# Patient Record
Sex: Male | Born: 1980 | Race: Black or African American | Hispanic: No | Marital: Single | Smoking: Never smoker
Health system: Southern US, Community
[De-identification: ages and names within clinical notes are randomized; demographics above are authoritative.]

## PROBLEM LIST (undated history)

## (undated) DIAGNOSIS — F329 Major depressive disorder, single episode, unspecified: Secondary | ICD-10-CM

## (undated) DIAGNOSIS — R05 Cough: Secondary | ICD-10-CM

## (undated) DIAGNOSIS — R059 Cough, unspecified: Secondary | ICD-10-CM

## (undated) DIAGNOSIS — R918 Other nonspecific abnormal finding of lung field: Secondary | ICD-10-CM

## (undated) DIAGNOSIS — F32A Depression, unspecified: Secondary | ICD-10-CM

## (undated) DIAGNOSIS — R0602 Shortness of breath: Secondary | ICD-10-CM

## (undated) HISTORY — DX: Depression, unspecified: F32.A

## (undated) HISTORY — DX: Cough: R05

## (undated) HISTORY — DX: Cough, unspecified: R05.9

## (undated) HISTORY — DX: Major depressive disorder, single episode, unspecified: F32.9

## (undated) HISTORY — DX: Shortness of breath: R06.02

---
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
## 2017-09-20 ENCOUNTER — Emergency Department
Admission: EM | Admit: 2017-09-20 | Discharge: 2017-09-20 | Disposition: A | Discharge status: Home / Self Care | Payer: No Typology Code available for payment source | Attending: Emergency Medicine | Admitting: Emergency Medicine

## 2017-09-20 ENCOUNTER — Emergency Department: Payer: No Typology Code available for payment source

## 2017-09-20 ENCOUNTER — Other Ambulatory Visit: Payer: Self-pay

## 2017-09-20 ENCOUNTER — Encounter: Payer: Self-pay | Admitting: Emergency Medicine

## 2017-09-20 DIAGNOSIS — Y929 Unspecified place or not applicable: Secondary | ICD-10-CM | POA: Insufficient documentation

## 2017-09-20 DIAGNOSIS — Y939 Activity, unspecified: Secondary | ICD-10-CM | POA: Diagnosis not present

## 2017-09-20 DIAGNOSIS — S0083XA Contusion of other part of head, initial encounter: Secondary | ICD-10-CM

## 2017-09-20 DIAGNOSIS — Y998 Other external cause status: Secondary | ICD-10-CM | POA: Insufficient documentation

## 2017-09-20 DIAGNOSIS — M7918 Myalgia, other site: Secondary | ICD-10-CM

## 2017-09-20 DIAGNOSIS — M549 Dorsalgia, unspecified: Secondary | ICD-10-CM | POA: Diagnosis not present

## 2017-09-20 DIAGNOSIS — M542 Cervicalgia: Secondary | ICD-10-CM | POA: Insufficient documentation

## 2017-09-20 DIAGNOSIS — S0990XA Unspecified injury of head, initial encounter: Secondary | ICD-10-CM | POA: Diagnosis present

## 2017-09-20 MED ORDER — NAPROXEN 500 MG PO TABS: 500.0000 mg | ORAL_TABLET | Freq: Two times a day (BID) | ORAL | Status: AC
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
## 2017-09-20 NOTE — Discharge Instructions (Signed)
Follow discharge care instruction take medication as directed. °
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
## 2017-09-20 NOTE — ED Triage Notes (Addendum)
Charting by Ivan Anchors, RN on 09/20/17 from [id number] entered on this pt in error.

## 2017-10-11 ENCOUNTER — Encounter: Payer: Self-pay | Admitting: Emergency Medicine

## 2017-10-11 ENCOUNTER — Emergency Department: Payer: Self-pay

## 2017-10-11 ENCOUNTER — Emergency Department
Admission: EM | Admit: 2017-10-11 | Discharge: 2017-10-11 | Disposition: A | Discharge status: Home / Self Care | Payer: Self-pay | Attending: Emergency Medicine | Admitting: Emergency Medicine

## 2017-10-11 DIAGNOSIS — T7840XA Allergy, unspecified, initial encounter: Secondary | ICD-10-CM | POA: Insufficient documentation

## 2017-10-11 DIAGNOSIS — J189 Pneumonia, unspecified organism: Secondary | ICD-10-CM | POA: Insufficient documentation

## 2017-10-11 DIAGNOSIS — R21 Rash and other nonspecific skin eruption: Secondary | ICD-10-CM | POA: Insufficient documentation

## 2017-10-11 DIAGNOSIS — R9389 Abnormal findings on diagnostic imaging of other specified body structures: Secondary | ICD-10-CM | POA: Insufficient documentation

## 2017-10-11 LAB — CBC
HCT: 42.3 % (ref 40.0–52.0)
Hemoglobin: 13.9 g/dL (ref 13.0–18.0)
MCH: 28.8 pg (ref 26.0–34.0)
MCHC: 32.9 g/dL (ref 32.0–36.0)
MCV: 87.5 fL (ref 80.0–100.0)
PLATELETS: 269 10*3/uL (ref 150–440)
RBC: 4.84 MIL/uL (ref 4.40–5.90)
RDW: 14 % (ref 11.5–14.5)
WBC: 10 10*3/uL (ref 3.8–10.6)

## 2017-10-11 LAB — BASIC METABOLIC PANEL
Anion gap: 9 (ref 5–15)
BUN: 11 mg/dL (ref 6–20)
CHLORIDE: 100 mmol/L — AB (ref 101–111)
CO2: 30 mmol/L (ref 22–32)
Calcium: 9.1 mg/dL (ref 8.9–10.3)
Creatinine, Ser: 1.25 mg/dL — ABNORMAL HIGH (ref 0.61–1.24)
GFR calc non Af Amer: >60 mL/min (ref >60)
GLUCOSE: 68 mg/dL (ref 65–99)
Potassium: 3.7 mmol/L (ref 3.5–5.1)
Sodium: 139 mmol/L (ref 135–145)

## 2017-10-11 LAB — TROPONIN I: Troponin I: <0.03 ng/mL (ref <0.03)

## 2017-10-11 MED ORDER — DOXYCYCLINE HYCLATE 100 MG PO CAPS: 100.0000 mg | ORAL_CAPSULE | Freq: Two times a day (BID) | ORAL | 0 refills | Status: AC

## 2017-10-11 MED ORDER — IOPAMIDOL (ISOVUE-370) INJECTION 76%
75.0000 mL | Freq: Once | INTRAVENOUS | Status: AC | PRN
  Administered 2017-10-11: 75 mL via INTRAVENOUS
  Filled 2017-10-11: qty 75

## 2017-10-11 MED ORDER — METHYLPREDNISOLONE SODIUM SUCC 125 MG IJ SOLR
125.0000 mg | Freq: Once | INTRAMUSCULAR | Status: AC
  Administered 2017-10-11: 125 mg via INTRAVENOUS
  Filled 2017-10-11 (×2): qty 2

## 2017-10-11 MED ORDER — SODIUM CHLORIDE 0.9 % IV BOLUS
1000.0000 mL | Freq: Once | INTRAVENOUS | Status: AC
  Administered 2017-10-11: 1000 mL via INTRAVENOUS
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
## 2017-10-11 NOTE — Discharge Instructions (Addendum)
Continue treating the allergic reaction with Benadryl at home, if you have increased hives, shortness of breath, trouble breathing fever, swelling of the tongue lips or throat or other progressive symptoms return to the emergency department.  Your CT scan shows what could be a pneumonia, however could also be an unusual finding such as a mass or other solid structure.  Is very difficult to tell in this early-stage exactly what it is.  We are concerned about this.  As we cannot tell for sure is a pneumonia but we are worried that it might be we will send you home with antibiotics, but we will also ask that you follow closely with Dr. Rogue Bussing in the next few days.  She has been made aware of your CT scan findings and she will follow closely.  It is to be hope that this is merely a pneumonia but if it is not or is something more serious you do absolutely need to follow-up.  Please do not neglect to do so because if this is a more serious pathology it could cause you real  harm if you delay.

## 2017-10-11 NOTE — ED Provider Notes (Addendum)
[facility] Emergency Department Provider Note  ____________________________________________   I have reviewed the triage vital signs and the nursing notes. Where available I have reviewed prior notes and, if possible and indicated, outside hospital notes.    HISTORY  Chief Complaint Allergic Reaction and Chest Pain    HPI Adam Higgins is a 37 y.o. male is healthy, runs etc.  He states that he was around some chlorine and then he started having yesterday and mild diffuse rash.  And then he woke up this morning and had a cough.  The cough is now gone.  He only coughed a few times he took some Mucinex and feels better.  He was having some pain when he took a breath.  No personal or family history of PE or DVT, he was having right-sided pleuritic chest pain however.  He denies any nausea or vomiting, or ongoing shortness of breath, he states after Benadryl yesterday his rash is nearly gone.  He feels that he was having an allergic reaction.  He does not have any history of personal or family history of PE or DVT no recent travel no leg swelling, no recent surgeries.     History reviewed. No pertinent past medical history.  There are no active problems to display for this patient.   History reviewed. No pertinent surgical history.  Prior to Admission medications   Medication Sig Start Date End Date Taking? Authorizing Provider  naproxen (NAPROSYN) 500 MG tablet Take 1 tablet (500 mg total) by mouth 2 (two) times daily with a meal. 09/20/17   Joni Reining, PA-C    Allergies Patient has no known allergies.  No family history on file.  Social History Social History   Tobacco Use  . Smoking status: Never Smoker  . Smokeless tobacco: Never Used  Substance Use Topics  . Alcohol use: Yes  . Drug use: Never    Review of Systems Constitutional: No fever/chills Eyes: No visual changes. ENT: No sore throat. No stiff neck no neck pain Cardiovascular:  See HPI Respiratory: The HPI Gastrointestinal:   no vomiting.  No diarrhea.  No constipation. Genitourinary: Negative for dysuria. Musculoskeletal: Negative lower extremity swelling Skin: + for rash. Neurological: Negative for severe headaches, focal weakness or numbness.   ____________________________________________   PHYSICAL EXAM:  VITAL SIGNS: ED Triage Vitals  Enc Vitals Group     BP 10/11/17 1540 103/87     Pulse Rate 10/11/17 1540 78     Resp 10/11/17 1540 18     Temp 10/11/17 1540 98 F (36.7 C)     Temp Source 10/11/17 1540 Oral     SpO2 10/11/17 1540 97 %     Weight 10/11/17 1539 170 lb (77.1 kg)     Height 10/11/17 1539  (1.803 m)     Head Circumference --      Peak Flow --      Pain Score 10/11/17 1538 6     Pain Loc --      Pain Edu? --      Excl. in GC? --     Constitutional: Alert and oriented. Well appearing and in no acute distress. Eyes: Conjunctivae are normal Head: Atraumatic HEENT: No congestion/rhinnorhea. Mucous membranes are moist.  Oropharynx non-erythematous Neck:   Nontender with no meningismus, no masses, no stridor Cardiovascular: Normal rate, regular rhythm. Grossly normal heart sounds.  Good peripheral circulation. Respiratory: Normal respiratory effort.  No retractions. Lungs CTAB. Abdominal: Soft and nontender. No  distention. No guarding no rebound Back:  There is no focal tenderness or step off.  there is no midline tenderness there are no lesions noted. there is no CVA tenderness Musculoskeletal: No lower extremity tenderness, no upper extremity tenderness. No joint effusions, no DVT signs strong distal pulses no edema Neurologic:  Normal speech and language. No gross focal neurologic deficits are appreciated.  Skin:  Skin is warm, dry and intact. No rash noted. Psychiatric: Mood and affect are normal. Speech and behavior are normal.  ____________________________________________   LABS (all labs ordered are listed, but only  abnormal results are displayed)  Labs Reviewed  BASIC METABOLIC PANEL - Abnormal; Notable for the following components:      Result Value   Chloride 100 (*)    Creatinine, Ser 1.25 (*)    All other components within normal limits  CBC  TROPONIN I    Pertinent labs  results that were available during my care of the patient were reviewed by me and considered in my medical decision making (see chart for details). ____________________________________________  EKG  I personally interpreted any EKGs ordered by me or triage Sinus rhythm, rate 66 bpm, no acute ST elevation or depression, nonspecific ST changes, repolarization abnormality noted ____________________________________________  RADIOLOGY  Pertinent labs & imaging results that were available during my care of the patient were reviewed by me and considered in my medical decision making (see chart for details). If possible, patient and/or family made aware of any abnormal findings.  Dg Chest 2 View  Result Date: 10/11/2017 CLINICAL DATA:  Chest pain with deep breathing beginning today. EXAM: CHEST - 2 VIEW COMPARISON:  None. FINDINGS: There is focal airspace disease in the right middle lobe worrisome for pneumonia. The left lung is clear. Heart size is normal. No pneumothorax or pleural effusion. IMPRESSION: Findings most consistent with right middle lobe pneumonia. Electronically Signed   By: Drusilla Kanner M.D.   On: 10/11/2017 16:12   ____________________________________________    PROCEDURES  Procedure(s) performed: None  Procedures  Critical Care performed: None  ____________________________________________   INITIAL IMPRESSION / ASSESSMENT AND PLAN / ED COURSE  Pertinent labs & imaging results that were available during my care of the patient were reviewed by me and considered in my medical decision making (see chart for details).  Patient here with a rash which apparently is mostly gone I do not see it at this  point, and also a pleuritic chest pain and cough, chest x-ray shows what looks to be a left peripheral pneumonia.  I am concerned that this may be a PE given pleuritic chest pain very low amount of pneumonia symptoms, and the appearance on chest x-ray.  I will obtain a CT scan to rule that out.  We will give him IV fluids and steroids for possible mild allergic reaction.  He could have a pulmonary pneumonitis I suppose from inhaling pool if [place] but again I think given that x-ray and the discordance between the degree of illness and the findings on chest x-ray further imaging is indicated  ----------------------------------------- 6:04 PM on 10/11/2017 ----------------------------------------- I personally reviewed x ray/ct D/w rads, they feel this could be round pna but also concern for ca.  He is in nad. We will treat with abx for possible pna but I am very concerned about these findings and I think he will need close oupt f/u with oncology. Paging onc.  Pt made aware of findings and concerns. Will start him on rx for cap.  Absolute need for f/u given and understood.   ----------------------------------------- 6:07 PM on 10/11/2017 -----------------------------------------  D/w dr. Rogue Bussing who agrees w Michaelle Copas and will f.u.     ____________________________________________   FINAL CLINICAL IMPRESSION(S) / ED DIAGNOSES  Final diagnoses:  None      This chart was dictated using voice recognition software.  Despite best efforts to proofread,  errors can occur which can change meaning.      Jeanmarie Plant, MD 10/11/17 1717    Jeanmarie Plant, MD 10/11/17 [phone]
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
## 2017-10-11 NOTE — ED Notes (Signed)
AAOx3.  Skin warm and dry.  NAD 

## 2017-10-19 ENCOUNTER — Encounter: Payer: Self-pay | Admitting: *Deleted

## 2017-10-19 ENCOUNTER — Encounter: Payer: Self-pay | Admitting: Hematology and Oncology

## 2017-10-19 ENCOUNTER — Inpatient Hospital Stay: Payer: Non-veteran care | Attending: Hematology and Oncology | Admitting: Hematology and Oncology

## 2017-10-19 VITALS — BP 139/76 | HR 83 | Temp 97.2°F | Resp 18 | Ht 71.0 in | Wt 160.0 lb

## 2017-10-19 DIAGNOSIS — R918 Other nonspecific abnormal finding of lung field: Secondary | ICD-10-CM | POA: Insufficient documentation

## 2017-10-19 DIAGNOSIS — Z77098 Contact with and (suspected) exposure to other hazardous, chiefly nonmedicinal, chemicals: Secondary | ICD-10-CM | POA: Diagnosis not present

## 2017-10-19 HISTORY — DX: Other nonspecific abnormal finding of lung field: R91.8

## 2017-10-19 NOTE — Progress Notes (Signed)
Patient c/o coughing, SOB
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
## 2017-10-19 NOTE — Progress Notes (Signed)
  Oncology Nurse Navigator Documentation  Navigator Location: [facility] (10/19/17 1500) Referral date to RadOnc/MedOnc: 10/12/17 (10/19/17 1500) )Navigator Encounter Type: Initial MedOnc (10/19/17 1500)   Abnormal Finding Date: 10/11/17 (10/19/17 1500)                   Treatment Phase: Abnormal Scans (10/19/17 1500) Barriers/Navigation Needs: Coordination of Care (10/19/17 1500)   Interventions: Coordination of Care (10/19/17 1500)   Coordination of Care: Appts;Radiology (10/19/17 1500)        Acuity: Level 1 (10/19/17 1500) Acuity Level 1: Initial guidance, education and coordination as needed;Minimal follow up required (10/19/17 1500)    met with patient during initial med-onc consultation with Dr. Mike Gip. All questions answered at the time of visit. Pt states is very anxious about CT scan results and nervous that he has cancer. Support and reassurance provided. Reviewed upcoming appts. Contact info given and instructed to call with any further questions or concerns. Pt verbalized understanding. Nothing further needed at this time.    Time Spent with Patient: 30 (10/19/17 1500)

## 2017-11-01 ENCOUNTER — Ambulatory Visit
Admission: RE | Admit: 2017-11-01 | Discharge: 2017-11-01 | Disposition: A | Discharge status: Home / Self Care | Payer: Non-veteran care | Source: Ambulatory Visit | Attending: Hematology and Oncology | Admitting: Hematology and Oncology

## 2017-11-01 DIAGNOSIS — R918 Other nonspecific abnormal finding of lung field: Secondary | ICD-10-CM | POA: Diagnosis present

## 2017-11-22 ENCOUNTER — Ambulatory Visit
Admission: RE | Admit: 2017-11-22 | Payer: Non-veteran care | Source: Ambulatory Visit | Admitting: Hematology and Oncology

## 2017-11-24 ENCOUNTER — Ambulatory Visit: Payer: Non-veteran care | Admitting: Hematology and Oncology

## 2018-12-26 ENCOUNTER — Ambulatory Visit (LOCAL_COMMUNITY_HEALTH_CENTER): Payer: Self-pay

## 2018-12-26 ENCOUNTER — Other Ambulatory Visit: Payer: Self-pay

## 2018-12-26 DIAGNOSIS — Z113 Encounter for screening for infections with a predominantly sexual mode of transmission: Secondary | ICD-10-CM

## 2018-12-26 DIAGNOSIS — N341 Nonspecific urethritis: Secondary | ICD-10-CM

## 2018-12-26 LAB — GRAM STAIN

## 2018-12-26 MED ORDER — AZITHROMYCIN 500 MG PO TABS
1000.0000 mg | ORAL_TABLET | Freq: Once | ORAL | Status: AC
  Administered 2018-12-26: 16:00:00 1000 mg via ORAL

## 2018-12-26 NOTE — Progress Notes (Addendum)
Here today for STD screening. Declines bloodwork. Elohim Brune, RN  Gram Stain results reviewed. Patient treated for NGU per standing orders. Amadu Schlageter, RN   

## 2018-12-26 NOTE — Progress Notes (Signed)
    STI clinic/screening visit  Subjective:  Adam Higgins is a 38 y.o. male being seen today for an STI screening visit. The patient reports they do not have symptoms.  Patient has the following medical conditions:  There are no active problems to display for this patient.    Chief Complaint  Patient presents with  . SEXUALLY TRANSMITTED DISEASE    HPI  Patient reports no sympts  but concerned that condom broke with sex on 12/21/2018,  Requests STD screen.  See flowsheet for further details and programmatic requirements.    The following portions of the patient's history were reviewed and updated as appropriate: allergies, current medications, past medical history, past social history, past surgical history and problem list.  Objective:  There were no vitals filed for this visit.  Physical Exam Constitutional:      Appearance: He is normal weight.  HENT:     Mouth/Throat:     Pharynx: Oropharynx is clear. No oropharyngeal exudate or posterior oropharyngeal erythema.  Neck:     Musculoskeletal: Neck supple. No muscular tenderness.  Abdominal:     General: Abdomen is flat.     Palpations: Abdomen is soft.  Genitourinary:    Penis: Normal.      Scrotum/Testes: Normal.  Lymphadenopathy:     Cervical: No cervical adenopathy.  Skin:    General: Skin is warm and dry.     Findings: No lesion or rash.  Neurological:     Mental Status: He is alert.    Assessment and Plan:  Adam Higgins is a 38 y.o. male presenting to the [facility] Department for STI screening  1. Screening examination for venereal disease - Gram stain - Gonococcus culture 2. NGU Treat gram stain for NGU with Azithromycin 1 gm po in a single dose Co. To notify partner to have treatment.  No sexual activity x 1 wk. Condoms always for STD prevention     No follow-ups on file.  No future appointments.  Hassell Done, FNP

## 2019-01-02 ENCOUNTER — Telehealth: Payer: Self-pay | Admitting: Physician Assistant

## 2019-01-02 ENCOUNTER — Ambulatory Visit: Payer: Self-pay | Admitting: Physician Assistant

## 2019-01-02 ENCOUNTER — Encounter: Payer: Self-pay | Admitting: Physician Assistant

## 2019-01-02 ENCOUNTER — Other Ambulatory Visit: Payer: Self-pay

## 2019-01-02 DIAGNOSIS — A5401 Gonococcal cystitis and urethritis, unspecified: Secondary | ICD-10-CM

## 2019-01-02 LAB — GONOCOCCUS CULTURE

## 2019-01-02 MED ORDER — AZITHROMYCIN 500 MG PO TABS
1000.0000 mg | ORAL_TABLET | Freq: Once | ORAL | Status: AC
  Administered 2019-01-02: 14:00:00 1000 mg via ORAL

## 2019-01-02 MED ORDER — CEFTRIAXONE SODIUM 250 MG IJ SOLR
250.0000 mg | Freq: Once | INTRAMUSCULAR | Status: AC
  Administered 2019-01-02: 14:00:00 250 mg via INTRAMUSCULAR

## 2019-01-02 NOTE — Progress Notes (Signed)
S:  Patient RTC for treatment due to positive test for GC.  Denies any current symptoms and that he has had sex since his visit on 12/26/2018.  NKDA. O:  WDWN male in NAD, A&O x 3; GC culture= positive. A/P:  1.  GC needs treatment 2. Will treat with Azithromycin 1g po DOT and Ceftriaxone 250mg  IM today  3.  No sex for 7 days and until after partner completes treatment 4.  Rec condoms with all sex 5.  RTC if vomits < 2 hr after completing medications for re-treatment. 6.  RTC 3 months for re-screening and prn.

## 2019-01-02 NOTE — Telephone Encounter (Signed)
Call to patient this am.  Identified patient with DOB and password from 12/26/2018 visit.  Counseled patient that [place] culture results show that he is positive for GC and needs to RTC for treatment.  Patient states that he can come in this am for treatment and will be here in a few minutes.  Patient overbooked on my schedule for treatment this am.

## 2019-03-12 ENCOUNTER — Other Ambulatory Visit: Payer: Self-pay

## 2019-03-12 DIAGNOSIS — Z20822 Contact with and (suspected) exposure to covid-19: Secondary | ICD-10-CM

## 2019-03-12 IMAGING — CT CT ANGIO CHEST
2 of 6 series · 18 of 46 positions shown · IV contrast (APPLIED)
Comparison: Chest radiograph 10/11/2017

CLINICAL DATA: Chest pain with deep breathing starting today.

EXAM:
CT ANGIOGRAPHY CHEST WITH CONTRAST
TECHNIQUE: Multidetector CT imaging of the chest was performed using the
standard protocol during bolus administration of intravenous
contrast. Multiplanar CT image reconstructions and MIPs were
obtained to evaluate the vascular anatomy.
CONTRAST:  75mL [id number]-GVK IOPAMIDOL ([id number]-GVK) INJECTION 76%

[Series 5: thins · axial · 0.67mm/px · z∈[-344,-70]mm · 15 of 302 slices shown]
[im 14/302  lung]
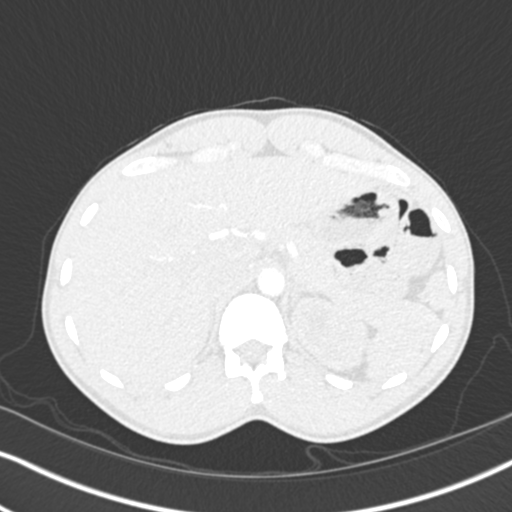
[im 40/302  soft-tissue]
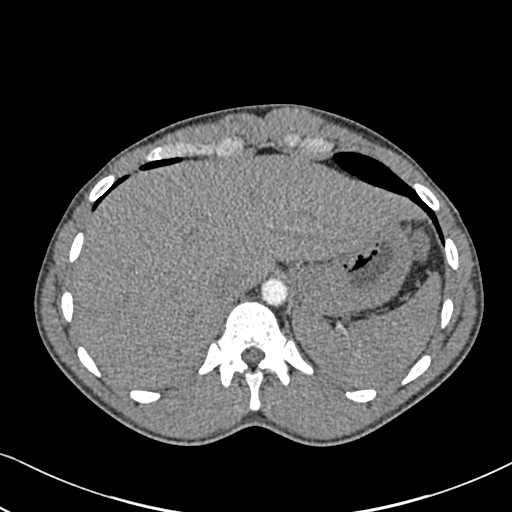
[im 53/302  lung]
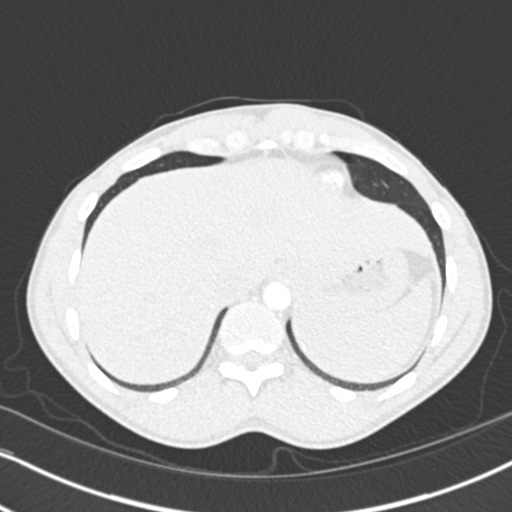
[im 79/302  soft-tissue]
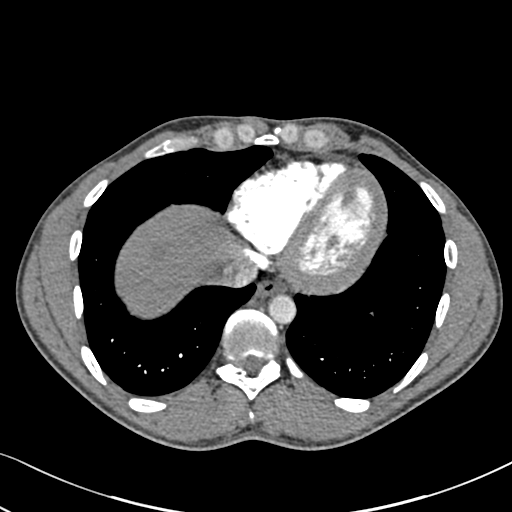
[im 92/302  lung]
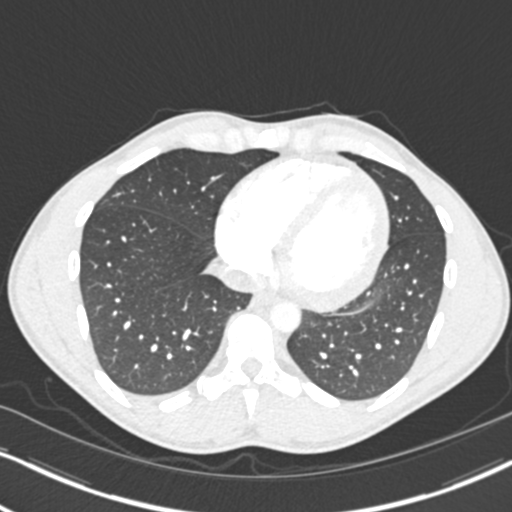
[im 118/302  soft-tissue]
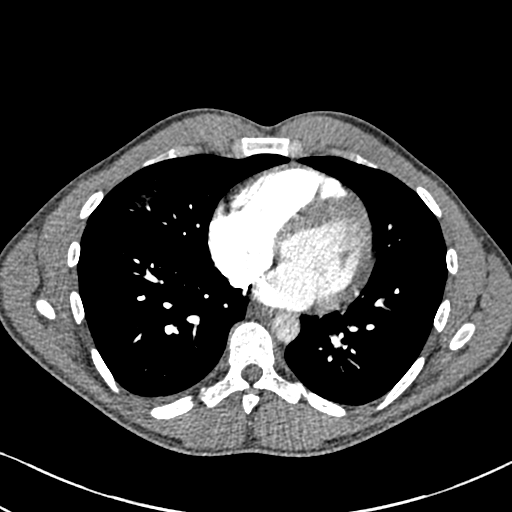
[im 131/302  lung]
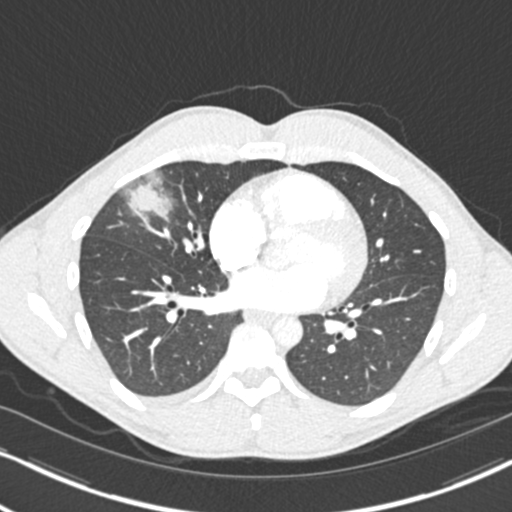
[im 158/302  soft-tissue]
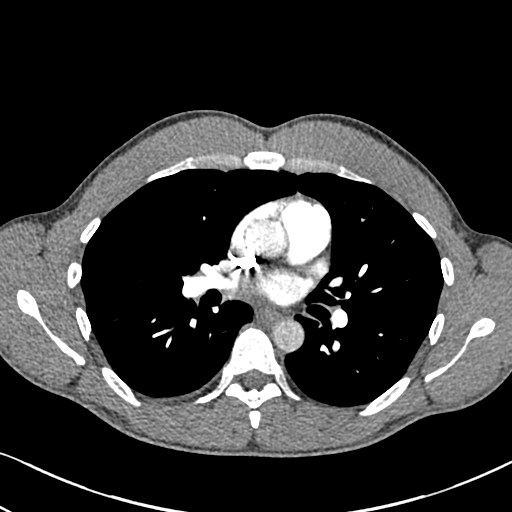
[im 171/302  lung]
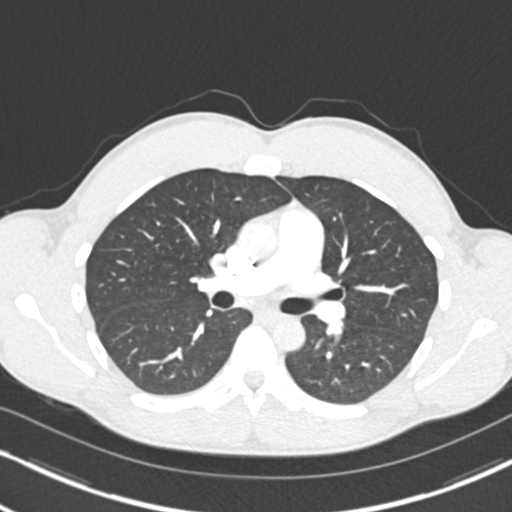
[im 184/302  soft-tissue]
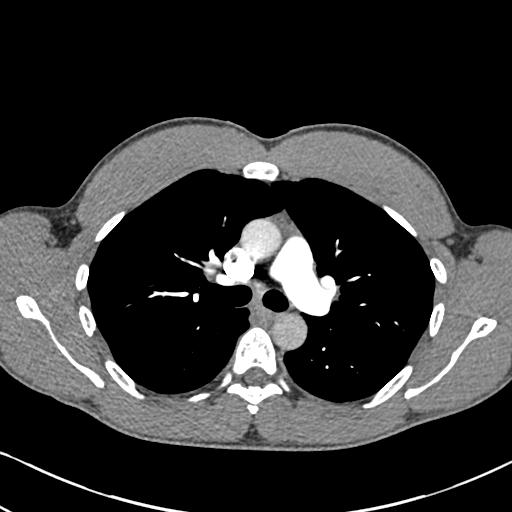
[im 210/302  lung]
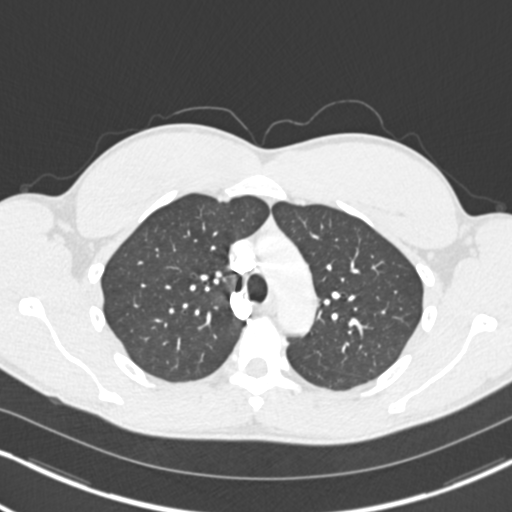
[im 223/302  soft-tissue]
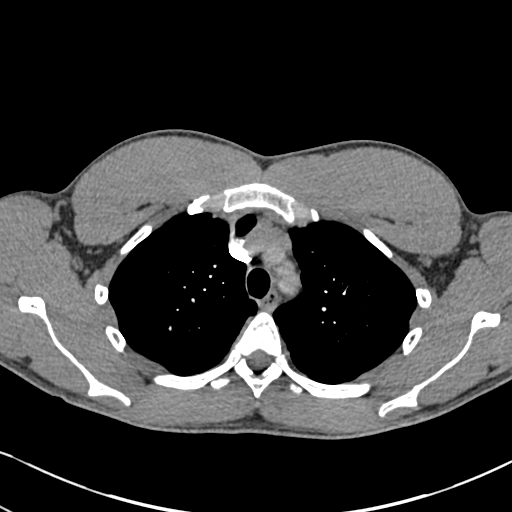
[im 249/302  lung]
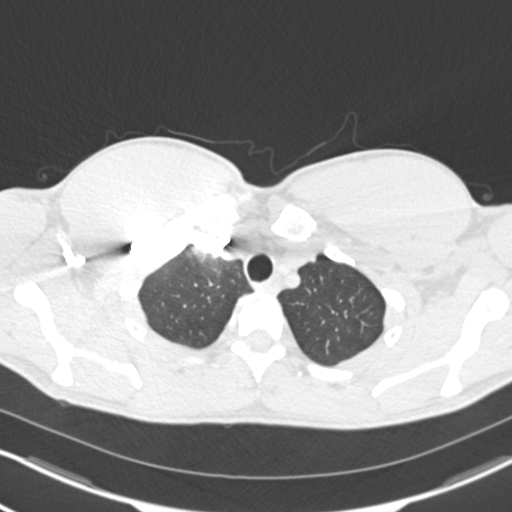
[im 262/302  soft-tissue]
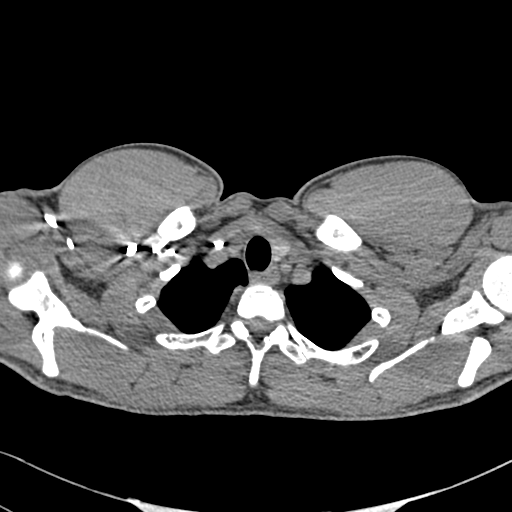
[im 288/302  lung]
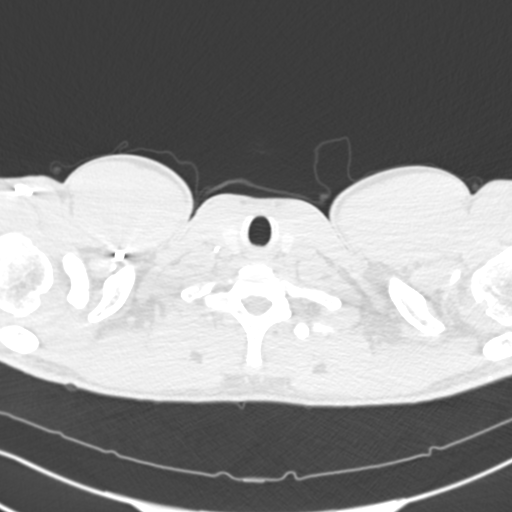

[Series 7: coronal mpr · coronal · 0.64mm/px · 3 of 78 slices shown]
[im 20/78  soft-tissue]
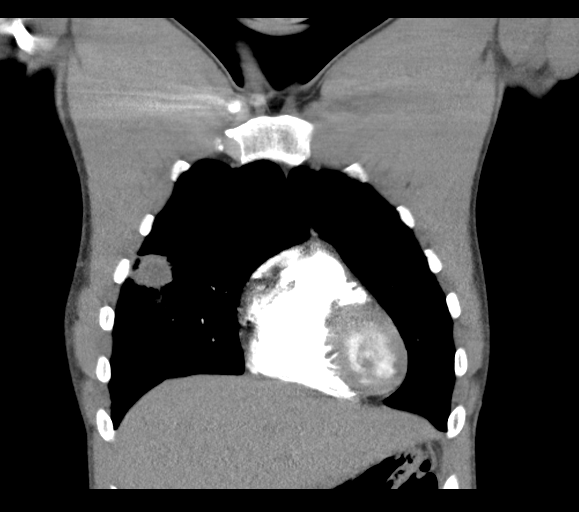
[im 39/78  soft-tissue]
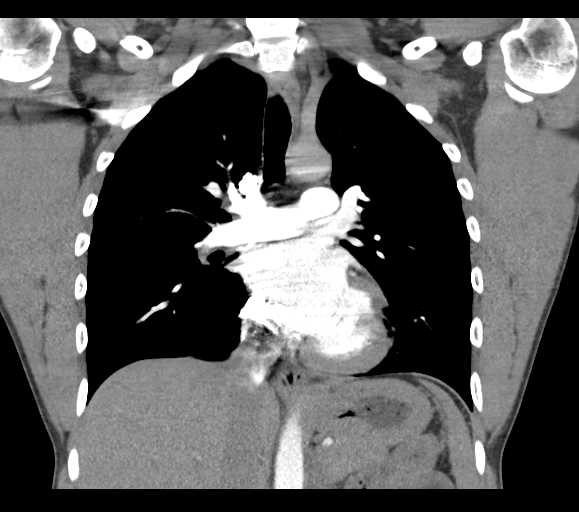
[im 58/78  soft-tissue]
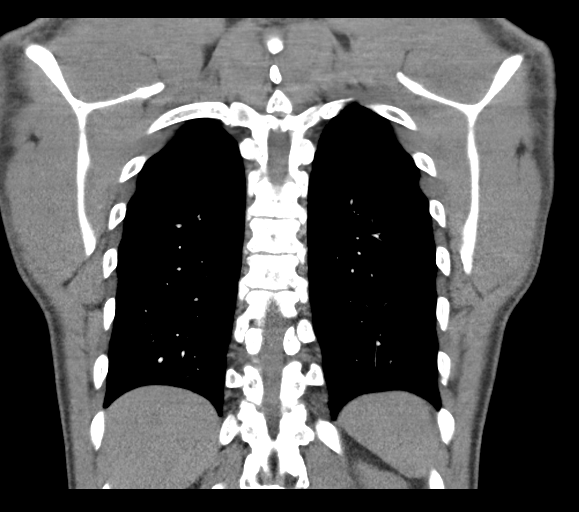

[18 of 46 positions shown; findings below may reference images not displayed]

FINDINGS: Cardiovascular: Satisfactory opacification of the pulmonary arteries
to the segmental level. No evidence of pulmonary embolism. Normal
heart size. No pericardial effusion.

Mediastinum/Nodes: No enlarged mediastinal, hilar, or axillary lymph
nodes. Thyroid gland, trachea, and esophagus demonstrate no
significant findings.

Lungs/Pleura: Round area of dense focal airspace consolidation
versus pulmonary mass measuring 2.5 cm in the subpleural anterior
aspect of the right middle lobe. Larger area of ground-glass opacity
surrounding this abnormality. No evidence of emphysematous changes,
pleural effusion or pneumothorax. The left lung is clear.

Upper Abdomen: No acute abnormality.

Musculoskeletal: No chest wall abnormality. No acute or significant
osseous findings.

Review of the MIP images confirms the above findings.
IMPRESSION: 2.5 cm right middle lobe subpleural round pneumonia versus pulmonary
mass.

No evidence of lymphadenopathy.

No evidence of vascular abnormalities.

These results were called by telephone at the time of interpretation
on 10/11/2017 at [DATE] to Dr. HELIEZER ESTHEFANY , who verbally
acknowledged these results.

## 2019-03-13 LAB — NOVEL CORONAVIRUS, NAA: SARS-CoV-2, NAA: NOT DETECTED

## 2019-07-09 ENCOUNTER — Other Ambulatory Visit: Payer: Self-pay

## 2019-07-09 ENCOUNTER — Ambulatory Visit: Payer: Self-pay | Admitting: Physician Assistant

## 2019-07-09 ENCOUNTER — Encounter: Payer: Self-pay | Admitting: Physician Assistant

## 2019-07-09 DIAGNOSIS — Z113 Encounter for screening for infections with a predominantly sexual mode of transmission: Secondary | ICD-10-CM

## 2019-07-09 LAB — GRAM STAIN

## 2019-07-10 NOTE — Progress Notes (Signed)
   [facility] Department STI clinic/screening visit  Subjective:  Adam Higgins is a 39 y.o. male being seen today for an STI screening visit. The patient reports they do not have symptoms.    Patient has the following medical conditions:  There are no problems to display for this patient.    Chief Complaint  Patient presents with  . SEXUALLY TRANSMITTED DISEASE    HPI  Patient reports that he does not have any symptoms but would like a screening today.  Denies chronic conditions, current medicines, and h/o surgery.   See flowsheet for further details and programmatic requirements.    The following portions of the patient's history were reviewed and updated as appropriate: allergies, current medications, past medical history, past social history, past surgical history and problem list.  Objective:  There were no vitals filed for this visit.  Physical Exam Constitutional:      General: He is not in acute distress.    Appearance: Normal appearance. He is normal weight.  HENT:     Head: Normocephalic and atraumatic.     Comments: No nits, lice, or hair loss. No cervical, supraclavicular or axillary adenopathy.    Mouth/Throat:     Mouth: Mucous membranes are moist.     Pharynx: Oropharynx is clear. No oropharyngeal exudate or posterior oropharyngeal erythema.  Eyes:     Conjunctiva/sclera: Conjunctivae normal.  Pulmonary:     Effort: Pulmonary effort is normal.  Abdominal:     Palpations: Abdomen is soft. There is no mass.     Tenderness: There is no abdominal tenderness. There is no guarding or rebound.  Genitourinary:    Penis: Normal.      Testes: Normal.     Comments: Pubic area without nits, lice, edema, erythema, lesions, and inguinal adenopathy. Penis circumcised, without rash, lesions and discharge at meatus. Musculoskeletal:     Cervical back: Neck supple. No tenderness.  Skin:    General: Skin is warm and dry.     Findings: No bruising,  erythema, lesion or rash.  Neurological:     Mental Status: He is alert and oriented to person, place, and time.  Psychiatric:        Mood and Affect: Mood normal.        Behavior: Behavior normal.        Thought Content: Thought content normal.        Judgment: Judgment normal.       Assessment and Plan:  Adam Higgins is a 39 y.o. male presenting to the [facility] Department for STI screening  1. Screening for STD (sexually transmitted disease) Patient into clinic without symptoms. Rec condoms with all sex. Await test results.  Counseled that RN will call if needs to RTC for treatment once results are back. - Gram stain - Gonococcus culture - HIV [place] [facility] - Syphilis Serology, [facility] - Gonococcus culture     No follow-ups on file.  No future appointments.  Matt Holmes, PA

## 2019-07-14 LAB — GONOCOCCUS CULTURE

## 2023-06-09 ENCOUNTER — Ambulatory Visit
Admission: RE | Admit: 2023-06-09 | Discharge: 2023-06-09 | Disposition: A | Discharge status: Home / Self Care | Payer: Non-veteran care | Source: Ambulatory Visit | Attending: Emergency Medicine | Admitting: Emergency Medicine

## 2023-06-09 DIAGNOSIS — R066 Hiccough: Secondary | ICD-10-CM | POA: Diagnosis not present

## 2023-06-09 DIAGNOSIS — K219 Gastro-esophageal reflux disease without esophagitis: Secondary | ICD-10-CM

## 2023-06-09 MED ORDER — METOCLOPRAMIDE HCL 5 MG PO TABS: 5.0000 mg | ORAL_TABLET | Freq: Three times a day (TID) | ORAL | 0 refills | Status: AC | PRN

## 2023-06-09 MED ORDER — BACLOFEN 5 MG PO TABS: 5.0000 mg | ORAL_TABLET | Freq: Three times a day (TID) | ORAL | 0 refills | Status: AC

## 2023-06-09 NOTE — Discharge Instructions (Signed)
Description of your symptoms I do believe that your hiccups are being caused by acid reflux and indigestion most likely flared by diet while on vacation  Take either Pepcid or Prilosec once daily for 2 weeks to really settle stomach acid which ideally will in turn minimize symptoms, does take a few days for medicine to begin to work  As needed you may use baclofen and/or Reglan every 8 hours in an attempt to minimize hiccups, Reglan also helps with nausea  While symptoms are present eat a bland diet with avoidance of spicy and greasy foods, overly seasoned foods, alcohol and smoking as these are known irritants  After eating sit up at least 30 minutes to further aid with digestion  If symptoms continue to persist please schedule follow-up appointment with primary doctor

## 2023-06-09 NOTE — ED Provider Notes (Signed)
Adam Higgins    CSN: [id number] Arrival date & time: 06/09/23  1137      History   Chief Complaint Chief Complaint  Patient presents with   Hiccups    HPI Adam Higgins is a 43 y.o. male.   Patient presents for evaluation of persistent hiccuping occurring every few seconds beginning 4 days ago.  Associated bloating and increased gas production with mild nausea and vomiting.  Able to tolerate food and liquids.  Endorses that he was on vacation in [place] where he had lots of smoke exposure and consumption of alcohol and did have some spicy things, believes after researching on [employer] this is related to acid reflux.  Took several doses of Prilosec and Pepcid all at once with no improvement.  Denies heartburn indigestion, abdominal pain, flank pain, urinary symptoms.  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   History reviewed. No pertinent surgical history.     Home Medications    Prior to Admission medications   Medication Sig Start Date End Date Taking? Authorizing Provider  baclofen 5 MG TABS Take 1 tablet (5 mg total) by mouth 3 (three) times daily. 06/09/23  Yes Jolanda Mccann R, NP  metoCLOPramide (REGLAN) 5 MG tablet Take 1 tablet (5 mg total) by mouth every 8 (eight) hours as needed for nausea. 06/09/23  Yes Aniyah Nobis, Elita Boone, NP  doxycycline (VIBRAMYCIN) 100 MG capsule Take 1 capsule (100 mg total) by mouth 2 (two) times daily. Patient not taking: Reported on 12/26/2018 10/11/17   Jeanmarie Plant, MD  naproxen (NAPROSYN) 500 MG tablet Take 1 tablet (500 mg total) by mouth 2 (two) times daily with a meal. Patient not taking: Reported on 12/26/2018 09/20/17   Joni Reining, PA-C    Family History History reviewed. No pertinent family history.  Social History Social History   Tobacco Use    Smoking status: Never   Smokeless tobacco: Never  Vaping Use   Vaping status: Never Used  Substance Use Topics   Alcohol use: Yes    Comment: occas.   Drug use: Not Currently    Types: Marijuana     Allergies   Patient has no known allergies.   Review of Systems Review of Systems   Physical Exam Triage Vital Signs ED Triage Vitals [06/09/23 1218]  Encounter Vitals Group     BP      Systolic BP Percentile      Diastolic BP Percentile      Pulse      Resp      Temp      Temp src      SpO2      Weight      Height      Head Circumference      Peak Flow      Pain Score 10     Pain Loc      Pain Education      Exclude from Growth Chart    No data found.  Updated Vital Signs There were no vitals taken for this visit.  Visual Acuity Right Eye Distance:   Left Eye Distance:   Bilateral Distance:    Right Eye Near:   Left Eye Near:    Bilateral Near:  Physical Exam Constitutional:      Appearance: Normal appearance.  HENT:     Mouth/Throat:     Pharynx: Posterior oropharyngeal erythema present. No oropharyngeal exudate.  Eyes:     Extraocular Movements: Extraocular movements intact.  Abdominal:     General: Abdomen is flat. Bowel sounds are normal. There is no distension.     Palpations: Abdomen is soft.     Tenderness: There is no abdominal tenderness. There is no guarding.  Musculoskeletal:     Cervical back: Normal range of motion and neck supple.  Lymphadenopathy:     Cervical: No cervical adenopathy.  Neurological:     Mental Status: He is alert and oriented to person, place, and time. Mental status is at baseline.      UC Treatments / Results  Labs (all labs ordered are listed, but only abnormal results are displayed) Labs Reviewed - No data to display  EKG   Radiology No results found.  Procedures Procedures (including critical care time)  Medications Ordered in UC Medications - No data to display  Initial Impression /  Assessment and Plan / UC Course  I have reviewed the triage vital signs and the nursing notes.  Pertinent labs & imaging results that were available during my care of the patient were reviewed by me and considered in my medical decision making (see chart for details).  Hiccups, GERD without esophagitis  Vitals are stable, patient is in no signs of distress nor toxic appearing, hiccuping within exam room, no abdominal tenderness noted, stable for outpatient management based on consumption of alcohol, smoke exposure and spicy foods do believe hiccuping is related to acid reflux therefore recommended use of Pepcid or Prilosec consistently taking once daily, additionally prescribed Reglan and baclofen for management of nausea and treatment of the hiccups and advised follow-up with PCP if symptoms continue to persist Final Clinical Impressions(s) / UC Diagnoses   Final diagnoses:  Hiccups  Gastroesophageal reflux disease without esophagitis     Discharge Instructions      Description of your symptoms I do believe that your hiccups are being caused by acid reflux and indigestion most likely flared by diet while on vacation  Take either Pepcid or Prilosec once daily for 2 weeks to really settle stomach acid which ideally will in turn minimize symptoms, does take a few days for medicine to begin to work  As needed you may use baclofen and/or Reglan every 8 hours in an attempt to minimize hiccups, Reglan also helps with nausea  While symptoms are present eat a bland diet with avoidance of spicy and greasy foods, overly seasoned foods, alcohol and smoking as these are known irritants  After eating sit up at least 30 minutes to further aid with digestion  If symptoms continue to persist please schedule follow-up appointment with primary doctor   ED Prescriptions     Medication Sig Dispense Auth. Provider   metoCLOPramide (REGLAN) 5 MG tablet Take 1 tablet (5 mg total) by mouth every 8 (eight)  hours as needed for nausea. 20 tablet Ana Liaw R, NP   baclofen 5 MG TABS Take 1 tablet (5 mg total) by mouth 3 (three) times daily. 20 tablet Valinda Hoar, NP      PDMP not reviewed this encounter.   Valinda Hoar, NP 06/09/23 1323

## 2023-06-09 NOTE — ED Triage Notes (Signed)
Patient presents to UC for hiccups x 4 days. Hx of GERD. Has tried Prilosec, Pepcid.
